# Patient Record
Sex: Female | Born: 1990 | Race: White | Hispanic: No | Marital: Married | Smoking: Never smoker
Health system: Southern US, Community
[De-identification: ages and names within clinical notes are randomized; demographics above are authoritative.]

## PROBLEM LIST (undated history)

## (undated) HISTORY — PX: WISDOM TOOTH EXTRACTION: SHX21

---

## 2017-01-08 DIAGNOSIS — Z01419 Encounter for gynecological examination (general) (routine) without abnormal findings: Secondary | ICD-10-CM | POA: Diagnosis not present

## 2017-01-08 DIAGNOSIS — Z6821 Body mass index (BMI) 21.0-21.9, adult: Secondary | ICD-10-CM | POA: Diagnosis not present

## 2017-10-09 DIAGNOSIS — J309 Allergic rhinitis, unspecified: Secondary | ICD-10-CM | POA: Diagnosis not present

## 2017-10-09 DIAGNOSIS — R05 Cough: Secondary | ICD-10-CM | POA: Diagnosis not present

## 2017-10-23 DIAGNOSIS — J359 Chronic disease of tonsils and adenoids, unspecified: Secondary | ICD-10-CM | POA: Diagnosis not present

## 2017-10-29 DIAGNOSIS — J342 Deviated nasal septum: Secondary | ICD-10-CM | POA: Diagnosis not present

## 2017-10-29 DIAGNOSIS — Z8709 Personal history of other diseases of the respiratory system: Secondary | ICD-10-CM | POA: Diagnosis not present

## 2017-10-29 DIAGNOSIS — D1801 Hemangioma of skin and subcutaneous tissue: Secondary | ICD-10-CM | POA: Diagnosis not present

## 2017-10-30 DIAGNOSIS — R7989 Other specified abnormal findings of blood chemistry: Secondary | ICD-10-CM | POA: Diagnosis not present

## 2018-05-21 DIAGNOSIS — Z6821 Body mass index (BMI) 21.0-21.9, adult: Secondary | ICD-10-CM | POA: Diagnosis not present

## 2018-05-21 DIAGNOSIS — Z124 Encounter for screening for malignant neoplasm of cervix: Secondary | ICD-10-CM | POA: Diagnosis not present

## 2018-05-21 DIAGNOSIS — Z01419 Encounter for gynecological examination (general) (routine) without abnormal findings: Secondary | ICD-10-CM | POA: Diagnosis not present

## 2018-05-24 ENCOUNTER — Other Ambulatory Visit: Payer: Self-pay | Admitting: Obstetrics

## 2018-05-24 DIAGNOSIS — N6320 Unspecified lump in the left breast, unspecified quadrant: Secondary | ICD-10-CM

## 2018-05-27 ENCOUNTER — Ambulatory Visit
Admission: RE | Admit: 2018-05-27 | Discharge: 2018-05-27 | Disposition: A | Discharge status: Home / Self Care | Payer: BLUE CROSS/BLUE SHIELD | Source: Ambulatory Visit | Attending: Obstetrics | Admitting: Obstetrics

## 2018-05-27 ENCOUNTER — Other Ambulatory Visit: Payer: Self-pay | Admitting: Obstetrics

## 2018-05-27 DIAGNOSIS — N6321 Unspecified lump in the left breast, upper outer quadrant: Secondary | ICD-10-CM | POA: Diagnosis not present

## 2018-05-27 DIAGNOSIS — N6320 Unspecified lump in the left breast, unspecified quadrant: Secondary | ICD-10-CM

## 2018-05-27 DIAGNOSIS — N6323 Unspecified lump in the left breast, lower outer quadrant: Secondary | ICD-10-CM | POA: Diagnosis not present

## 2018-06-04 ENCOUNTER — Ambulatory Visit
Admission: RE | Admit: 2018-06-04 | Discharge: 2018-06-04 | Disposition: A | Discharge status: Home / Self Care | Payer: BLUE CROSS/BLUE SHIELD | Source: Ambulatory Visit | Attending: Obstetrics | Admitting: Obstetrics

## 2018-06-04 DIAGNOSIS — N6320 Unspecified lump in the left breast, unspecified quadrant: Secondary | ICD-10-CM

## 2018-06-04 DIAGNOSIS — D242 Benign neoplasm of left breast: Secondary | ICD-10-CM | POA: Diagnosis not present

## 2018-06-04 DIAGNOSIS — N6321 Unspecified lump in the left breast, upper outer quadrant: Secondary | ICD-10-CM | POA: Diagnosis not present

## 2018-10-12 DIAGNOSIS — N644 Mastodynia: Secondary | ICD-10-CM | POA: Diagnosis not present

## 2018-10-12 DIAGNOSIS — Z6822 Body mass index (BMI) 22.0-22.9, adult: Secondary | ICD-10-CM | POA: Diagnosis not present

## 2019-04-21 DIAGNOSIS — N390 Urinary tract infection, site not specified: Secondary | ICD-10-CM | POA: Diagnosis not present

## 2019-04-21 DIAGNOSIS — Z6822 Body mass index (BMI) 22.0-22.9, adult: Secondary | ICD-10-CM | POA: Diagnosis not present

## 2019-05-13 DIAGNOSIS — N390 Urinary tract infection, site not specified: Secondary | ICD-10-CM | POA: Diagnosis not present

## 2019-07-28 DIAGNOSIS — Z01419 Encounter for gynecological examination (general) (routine) without abnormal findings: Secondary | ICD-10-CM | POA: Diagnosis not present

## 2019-07-28 DIAGNOSIS — Z6822 Body mass index (BMI) 22.0-22.9, adult: Secondary | ICD-10-CM | POA: Diagnosis not present

## 2019-12-22 IMAGING — US US BREAST BX W LOC DEV 1ST LESION IMG BX SPEC US GUIDE*L*
1 series · 12 of 12 positions shown · non-contrast
Comparison: Previous exam(s).

ADDENDUM:
Pathology revealed FIBROADENOMA of the Left breast, upper outer,
2:30 o'clock. This was found to be concordant by Dr. Guyano Babajee.

Pathology results were discussed with the patient by telephone. The
patient reported doing well after the biopsy with tenderness at the
site. Post biopsy instructions and care were reviewed and questions
were answered. The patient was encouraged to call The [REDACTED]
The patient was instructed to continue with monthly self breast
examinations, clinical follow-up as needed, and to return for annual
mammography at 39. The patient was informed a reminder notice would
be sent regarding this appointment.
Pathology results reported by Rolf Kristian Langeteig, RN on 06/08/2018.
CLINICAL DATA: 27-year-old female for tissue sampling of
UPPER-OUTER LEFT breast mass.
EXAM:
ULTRASOUND GUIDED LEFT BREAST CORE NEEDLE BIOPSY

[Series 1: us breast bx w loc dev 1st lesion img bx spec us g · 0.05mm/px · 12 of 12 slices shown]
[im 1/12]
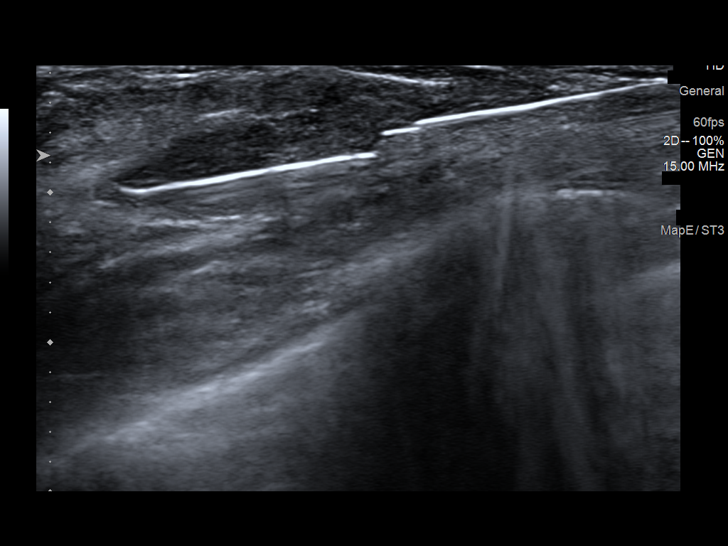
[im 2/12]
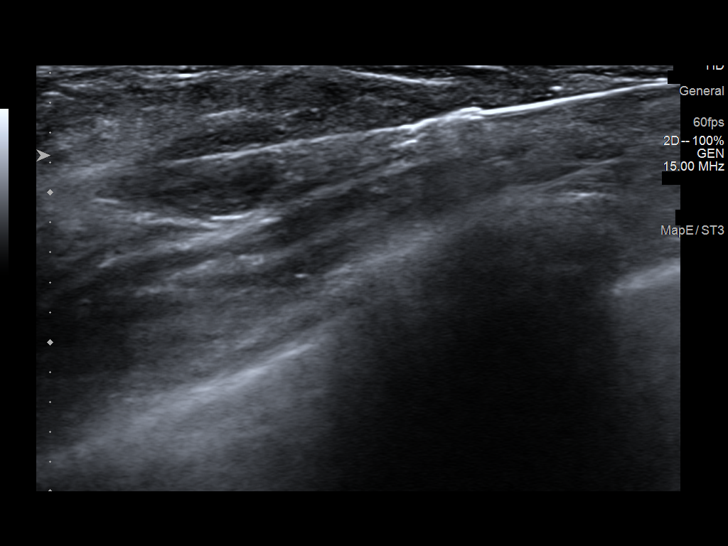
[im 3/12]
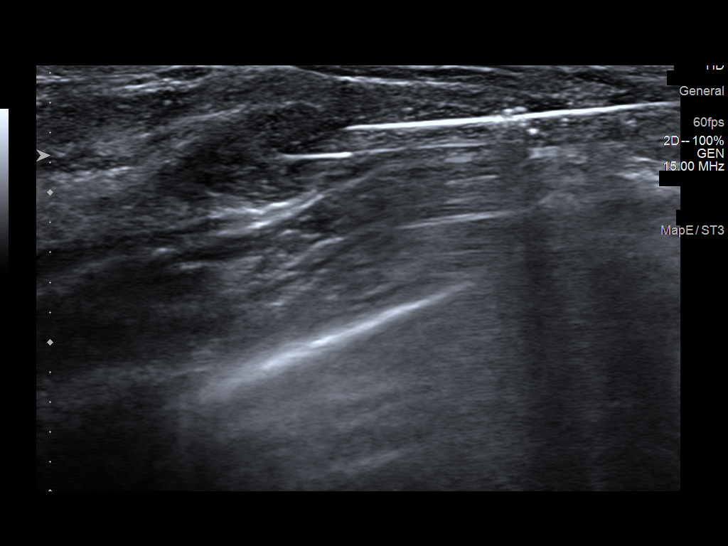
[im 4/12]
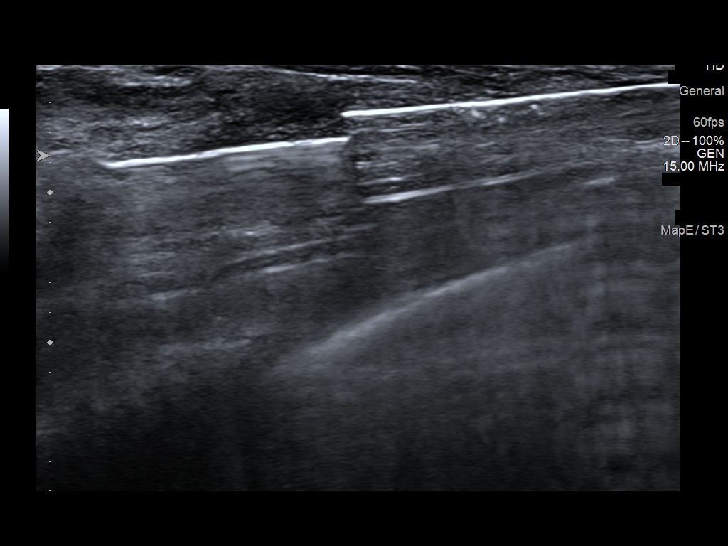
[im 5/12]
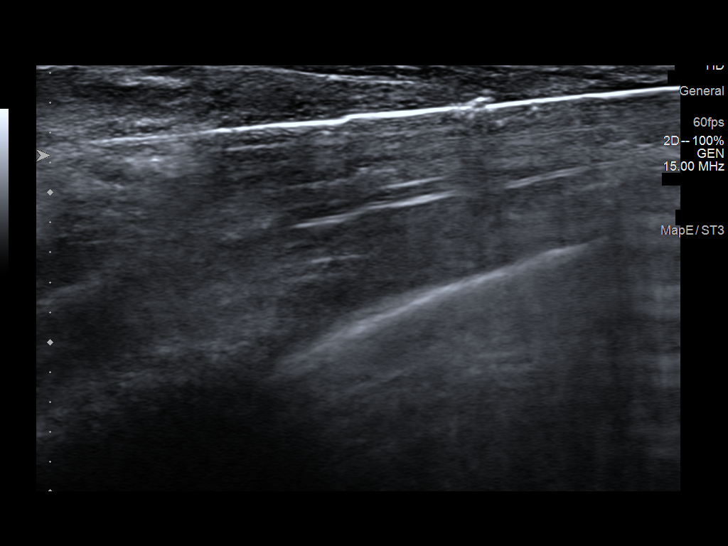
[im 6/12]
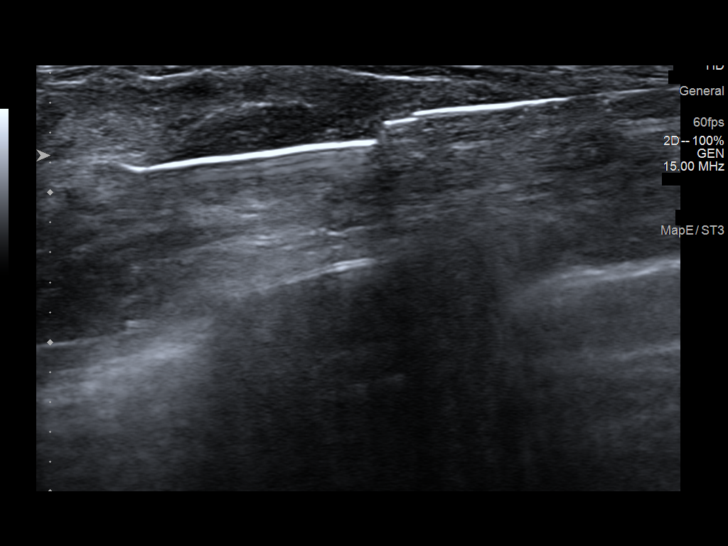
[im 7/12]
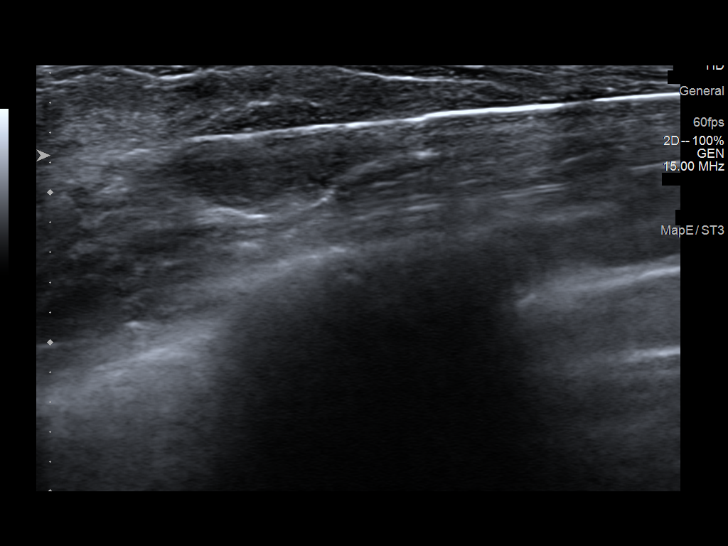
[im 8/12]
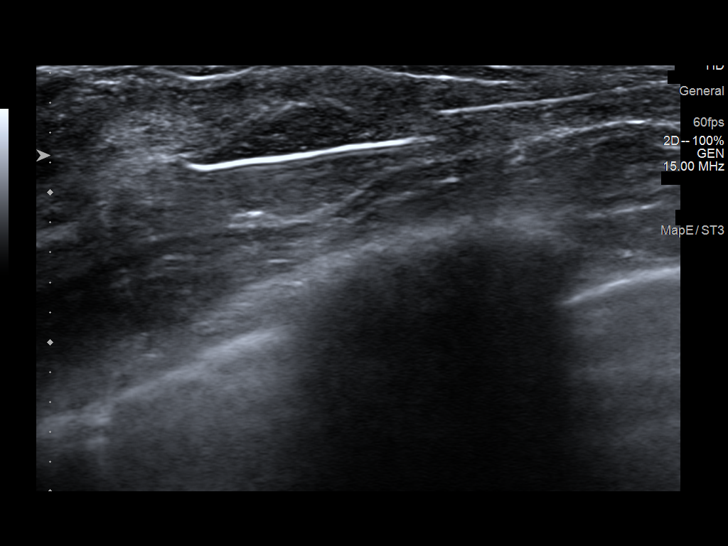
[im 9/12]
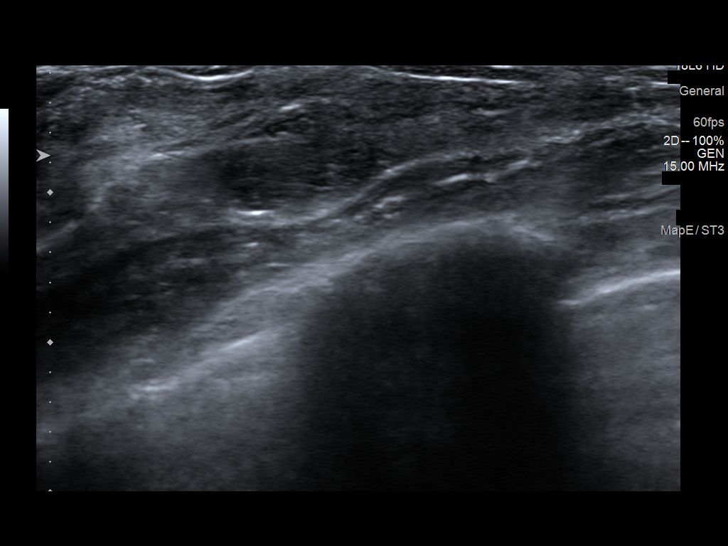
[im 10/12]
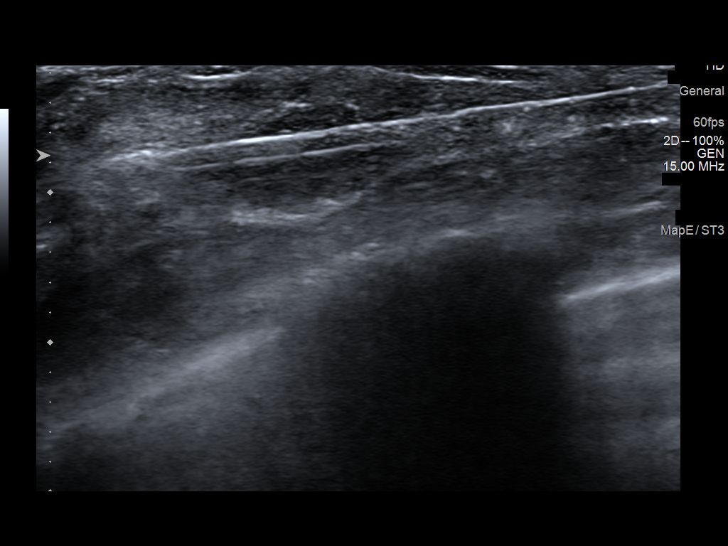
[im 11/12]
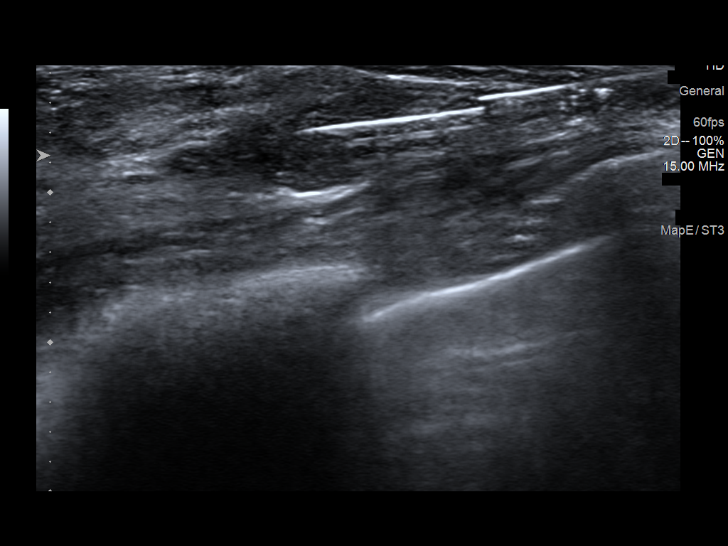
[im 12/12]
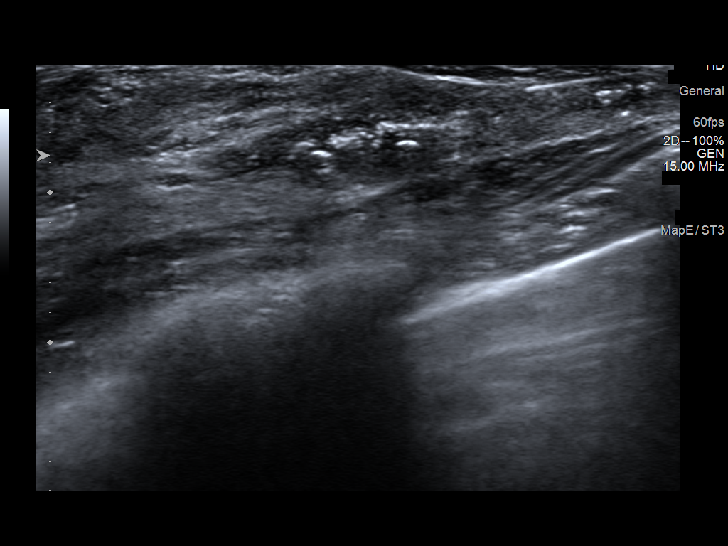

[12 of 12 positions shown; findings below may reference images not displayed]



Lesion quadrant: UPPER-OUTER LEFT breast

Using sterile technique and 1% Lidocaine as local anesthetic, under
direct ultrasound visualization, a 14 gauge Martinenaite device was
used to perform biopsy of the 1.3 cm mass at the [DATE] position of
the LEFT breast 8 cm from the nipple using a LATERAL approach.

At the conclusion of the procedure a spiral HydroMARK tissue marker
clip was deployed into the mass, with placement confirmed
sonographically
IMPRESSION: Ultrasound guided biopsy of 1.3 cm UPPER-OUTER LEFT breast mass. No
apparent complications.

## 2020-06-04 DIAGNOSIS — N925 Other specified irregular menstruation: Secondary | ICD-10-CM | POA: Diagnosis not present

## 2020-06-04 DIAGNOSIS — Z3481 Encounter for supervision of other normal pregnancy, first trimester: Secondary | ICD-10-CM | POA: Diagnosis not present

## 2020-06-04 DIAGNOSIS — Z113 Encounter for screening for infections with a predominantly sexual mode of transmission: Secondary | ICD-10-CM | POA: Diagnosis not present

## 2020-06-04 DIAGNOSIS — Z3480 Encounter for supervision of other normal pregnancy, unspecified trimester: Secondary | ICD-10-CM | POA: Diagnosis not present

## 2020-06-04 DIAGNOSIS — Z23 Encounter for immunization: Secondary | ICD-10-CM | POA: Diagnosis not present

## 2020-06-04 DIAGNOSIS — Z3201 Encounter for pregnancy test, result positive: Secondary | ICD-10-CM | POA: Diagnosis not present

## 2020-07-06 DIAGNOSIS — Z3480 Encounter for supervision of other normal pregnancy, unspecified trimester: Secondary | ICD-10-CM | POA: Diagnosis not present

## 2020-07-06 DIAGNOSIS — Z369 Encounter for antenatal screening, unspecified: Secondary | ICD-10-CM | POA: Diagnosis not present

## 2020-07-06 DIAGNOSIS — Z3481 Encounter for supervision of other normal pregnancy, first trimester: Secondary | ICD-10-CM | POA: Diagnosis not present

## 2020-07-06 LAB — OB RESULTS CONSOLE GC/CHLAMYDIA
Chlamydia: NEGATIVE
Gonorrhea: NEGATIVE

## 2020-07-06 LAB — OB RESULTS CONSOLE ABO/RH: RH Type: POSITIVE

## 2020-07-06 LAB — OB RESULTS CONSOLE HEPATITIS B SURFACE ANTIGEN: Hepatitis B Surface Ag: NEGATIVE

## 2020-07-06 LAB — OB RESULTS CONSOLE HIV ANTIBODY (ROUTINE TESTING): HIV: NONREACTIVE

## 2020-07-06 LAB — OB RESULTS CONSOLE RUBELLA ANTIBODY, IGM: Rubella: IMMUNE

## 2020-07-06 LAB — OB RESULTS CONSOLE RPR: RPR: NONREACTIVE

## 2020-08-03 DIAGNOSIS — Z3482 Encounter for supervision of other normal pregnancy, second trimester: Secondary | ICD-10-CM | POA: Diagnosis not present

## 2020-08-03 DIAGNOSIS — Z369 Encounter for antenatal screening, unspecified: Secondary | ICD-10-CM | POA: Diagnosis not present

## 2020-08-18 NOTE — L&D Delivery Note (Signed)
Delivery Note Emily Burch is a G1P0 at [redacted]w[redacted]d who had a spontaneous delivery at 05:06  a viable female was delivered via  LOA.  APGAR: 8 ,9  ; weight pending  .    Admitted for labor. Progressed normally. Received epidural for pain management. AROM at 03:13. Pushed for 34 minutes. Baby was delivered without difficulty. Loose nuchal cord x 1.   Delayed cord clamping for 60 seconds. Delivery of placenta was spontaneous.   Third degree (type 3A) perineal laceration was identified, involving <50% of the EAS. The torn portion of the EAS was identified, grasped, and reapproximated using 2-0 vicryl suture in 3 interrupted stitches. The perineal portion was repaired with 3-0 vicryl in normal sterile fashion. A superficial bleeding right periurethral laceration was noted. A straight catheter was placed for urethral identification and the laceration was repaired with 3-0 vicryl in 2 figure of 8 stitches. Good hemostasis appreciated. DRE with good rectal tone and no sutures.  Uterine fundus firm. Estimated blood loss 250cc.  Instrument and gauze counts were correct at the end of the procedure. Ancef 2g x 1 ordered due to 3rd degree laceration.    Placenta status: to L&D Anesthesia:  epidural Episiotomy:  none Lacerations:  Type 3A, right periurethral Suture Repair: 2.0 3.0 vicryl Est. Blood Loss (mL):   Mom to postpartum.  Baby to Couplet care / Skin to Skin.  [name] 01/14/2021, 5:49 AM

## 2020-08-30 ENCOUNTER — Other Ambulatory Visit: Payer: Self-pay | Admitting: Obstetrics and Gynecology

## 2020-08-30 ENCOUNTER — Other Ambulatory Visit: Payer: Self-pay

## 2020-08-30 ENCOUNTER — Ambulatory Visit: Payer: BC Managed Care – PPO | Attending: Obstetrics and Gynecology

## 2020-08-30 DIAGNOSIS — O322XX0 Maternal care for transverse and oblique lie, not applicable or unspecified: Secondary | ICD-10-CM | POA: Diagnosis not present

## 2020-08-30 DIAGNOSIS — Z3A19 19 weeks gestation of pregnancy: Secondary | ICD-10-CM | POA: Diagnosis not present

## 2020-08-30 DIAGNOSIS — Z363 Encounter for antenatal screening for malformations: Secondary | ICD-10-CM

## 2020-09-07 DIAGNOSIS — Z369 Encounter for antenatal screening, unspecified: Secondary | ICD-10-CM | POA: Diagnosis not present

## 2020-10-11 DIAGNOSIS — Z369 Encounter for antenatal screening, unspecified: Secondary | ICD-10-CM | POA: Diagnosis not present

## 2020-10-26 DIAGNOSIS — Z369 Encounter for antenatal screening, unspecified: Secondary | ICD-10-CM | POA: Diagnosis not present

## 2020-10-26 DIAGNOSIS — Z3480 Encounter for supervision of other normal pregnancy, unspecified trimester: Secondary | ICD-10-CM | POA: Diagnosis not present

## 2020-11-09 DIAGNOSIS — Z369 Encounter for antenatal screening, unspecified: Secondary | ICD-10-CM | POA: Diagnosis not present

## 2020-11-27 DIAGNOSIS — Z369 Encounter for antenatal screening, unspecified: Secondary | ICD-10-CM | POA: Diagnosis not present

## 2020-11-27 DIAGNOSIS — R8271 Bacteriuria: Secondary | ICD-10-CM | POA: Diagnosis not present

## 2020-12-10 DIAGNOSIS — Z369 Encounter for antenatal screening, unspecified: Secondary | ICD-10-CM | POA: Diagnosis not present

## 2020-12-26 DIAGNOSIS — Z369 Encounter for antenatal screening, unspecified: Secondary | ICD-10-CM | POA: Diagnosis not present

## 2020-12-26 DIAGNOSIS — Z3480 Encounter for supervision of other normal pregnancy, unspecified trimester: Secondary | ICD-10-CM | POA: Diagnosis not present

## 2020-12-26 LAB — OB RESULTS CONSOLE GBS: GBS: NEGATIVE

## 2021-01-02 DIAGNOSIS — Z369 Encounter for antenatal screening, unspecified: Secondary | ICD-10-CM | POA: Diagnosis not present

## 2021-01-13 ENCOUNTER — Other Ambulatory Visit: Payer: Self-pay

## 2021-01-13 ENCOUNTER — Encounter (HOSPITAL_COMMUNITY): Payer: Self-pay | Admitting: Obstetrics and Gynecology

## 2021-01-13 ENCOUNTER — Inpatient Hospital Stay (HOSPITAL_COMMUNITY)
Admission: AD | Admit: 2021-01-13 | Discharge: 2021-01-16 | DRG: 768 | Disposition: A | Discharge status: Home / Self Care | Payer: BC Managed Care – PPO | Attending: Obstetrics and Gynecology | Admitting: Obstetrics and Gynecology

## 2021-01-13 ENCOUNTER — Inpatient Hospital Stay (HOSPITAL_COMMUNITY): Payer: BC Managed Care – PPO | Admitting: Anesthesiology

## 2021-01-13 DIAGNOSIS — U071 COVID-19: Secondary | ICD-10-CM | POA: Diagnosis present

## 2021-01-13 DIAGNOSIS — Z3A39 39 weeks gestation of pregnancy: Secondary | ICD-10-CM | POA: Diagnosis not present

## 2021-01-13 DIAGNOSIS — O479 False labor, unspecified: Secondary | ICD-10-CM

## 2021-01-13 DIAGNOSIS — O9852 Other viral diseases complicating childbirth: Secondary | ICD-10-CM | POA: Diagnosis not present

## 2021-01-13 DIAGNOSIS — O26893 Other specified pregnancy related conditions, third trimester: Secondary | ICD-10-CM | POA: Diagnosis not present

## 2021-01-13 LAB — CBC
HCT: 42 % (ref 36.0–46.0)
Hemoglobin: 13.9 g/dL (ref 12.0–15.0)
MCH: 31.5 pg (ref 26.0–34.0)
MCHC: 33.1 g/dL (ref 30.0–36.0)
MCV: 95.2 fL (ref 80.0–100.0)
Platelets: 201 10*3/uL (ref 150–400)
RBC: 4.41 MIL/uL (ref 3.87–5.11)
RDW: 12.9 % (ref 11.5–15.5)
WBC: 13.2 10*3/uL — ABNORMAL HIGH (ref 4.0–10.5)
nRBC: 0 % (ref 0.0–0.2)

## 2021-01-13 LAB — RESP PANEL BY RT-PCR (FLU A&B, COVID) ARPGX2
Influenza A by PCR: NEGATIVE
Influenza B by PCR: NEGATIVE
SARS Coronavirus 2 by RT PCR: POSITIVE — AB

## 2021-01-13 LAB — TYPE AND SCREEN
ABO/RH(D): O POS
Antibody Screen: NEGATIVE

## 2021-01-13 MED ORDER — PHENYLEPHRINE 40 MCG/ML (10ML) SYRINGE FOR IV PUSH (FOR BLOOD PRESSURE SUPPORT): 80.0000 ug | PREFILLED_SYRINGE | INTRAVENOUS | Status: DC | PRN

## 2021-01-13 MED ORDER — SOD CITRATE-CITRIC ACID 500-334 MG/5ML PO SOLN: 30.0000 mL | ORAL | Status: DC | PRN

## 2021-01-13 MED ORDER — LIDOCAINE HCL (PF) 1 % IJ SOLN: 30.0000 mL | INTRAMUSCULAR | Status: DC | PRN

## 2021-01-13 MED ORDER — FENTANYL CITRATE (PF) 100 MCG/2ML IJ SOLN: 50.0000 ug | INTRAMUSCULAR | Status: DC | PRN

## 2021-01-13 MED ORDER — ONDANSETRON HCL 4 MG/2ML IJ SOLN: 4.0000 mg | Freq: Four times a day (QID) | INTRAMUSCULAR | Status: DC | PRN

## 2021-01-13 MED ORDER — EPHEDRINE 5 MG/ML INJ: 10.0000 mg | INTRAVENOUS | Status: DC | PRN

## 2021-01-13 MED ORDER — LACTATED RINGERS IV SOLN
500.0000 mL | Freq: Once | INTRAVENOUS | Status: AC
  Administered 2021-01-13: 500 mL via INTRAVENOUS

## 2021-01-13 MED ORDER — OXYTOCIN BOLUS FROM INFUSION
333.0000 mL | Freq: Once | INTRAVENOUS | Status: AC
  Administered 2021-01-14: 333 mL via INTRAVENOUS

## 2021-01-13 MED ORDER — OXYCODONE-ACETAMINOPHEN 5-325 MG PO TABS
2.0000 | ORAL_TABLET | ORAL | Status: DC | PRN
Start: 2021-01-13 — End: 2021-01-14

## 2021-01-13 MED ORDER — FENTANYL-BUPIVACAINE-NACL 0.5-0.125-0.9 MG/250ML-% EP SOLN: 12.0000 mL/h | EPIDURAL | Status: DC | PRN

## 2021-01-13 MED ORDER — DIPHENHYDRAMINE HCL 50 MG/ML IJ SOLN: 12.5000 mg | INTRAMUSCULAR | Status: DC | PRN

## 2021-01-13 MED ORDER — OXYTOCIN-SODIUM CHLORIDE 30-0.9 UT/500ML-% IV SOLN
2.5000 [IU]/h | INTRAVENOUS | Status: DC
  Administered 2021-01-14: 2.5 [IU]/h via INTRAVENOUS
  Filled 2021-01-13: qty 500

## 2021-01-13 MED ORDER — LACTATED RINGERS IV SOLN
500.0000 mL | INTRAVENOUS | Status: DC | PRN
Start: 2021-01-13 — End: 2021-01-14

## 2021-01-13 MED ORDER — ACETAMINOPHEN 325 MG PO TABS: 650.0000 mg | ORAL_TABLET | ORAL | Status: DC | PRN

## 2021-01-13 MED ORDER — OXYCODONE-ACETAMINOPHEN 5-325 MG PO TABS: 1.0000 | ORAL_TABLET | ORAL | Status: DC | PRN

## 2021-01-13 MED ORDER — DIPHENHYDRAMINE HCL 50 MG/ML IJ SOLN
12.5000 mg | INTRAMUSCULAR | Status: DC | PRN
Start: 2021-01-13 — End: 2021-01-14

## 2021-01-13 MED ORDER — LACTATED RINGERS IV SOLN: INTRAVENOUS | Status: DC

## 2021-01-13 MED ORDER — LIDOCAINE HCL (PF) 1 % IJ SOLN
INTRAMUSCULAR | Status: DC | PRN
  Administered 2021-01-13: 8 mL via EPIDURAL

## 2021-01-13 MED ORDER — FENTANYL-BUPIVACAINE-NACL 0.5-0.125-0.9 MG/250ML-% EP SOLN
12.0000 mL/h | EPIDURAL | Status: DC | PRN
  Administered 2021-01-13: 12 mL/h via EPIDURAL
  Filled 2021-01-13: qty 250

## 2021-01-13 NOTE — MAU Note (Signed)
.   Emily Burch is a 30 y.o. at [redacted]w[redacted]d here in MAU reporting: ctx that became more intense this morning around 0500. She also reports bloody show. Denies LOF. Endorses fetal movement.   Pain score: 6 Vitals:   01/13/21 1733  BP: 139/81  Pulse: 88  Resp: 16  Temp: 98.4 F (36.9 C)

## 2021-01-13 NOTE — Anesthesia Preprocedure Evaluation (Signed)
Anesthesia Evaluation  Patient identified by MRN, date of birth, ID band Patient awake    Reviewed: Allergy & Precautions, H&P , NPO status , Patient's Chart, lab work & pertinent test results, reviewed documented beta blocker date and time   Airway Mallampati: I  TM Distance: >3 FB Neck ROM: full    Dental no notable dental hx. (+) Teeth Intact, Dental Advisory Given   Pulmonary neg pulmonary ROS,    Pulmonary exam normal breath sounds clear to auscultation       Cardiovascular negative cardio ROS Normal cardiovascular exam Rhythm:regular Rate:Normal     Neuro/Psych negative neurological ROS  negative psych ROS   GI/Hepatic negative GI ROS, Neg liver ROS,   Endo/Other  negative endocrine ROS  Renal/GU negative Renal ROS  negative genitourinary   Musculoskeletal   Abdominal   Peds  Hematology negative hematology ROS (+)   Anesthesia Other Findings   Reproductive/Obstetrics (+) Pregnancy                             Anesthesia Physical Anesthesia Plan  ASA: II  Anesthesia Plan: Epidural   Post-op Pain Management:    Induction:   PONV Risk Score and Plan: 2  Airway Management Planned: Natural Airway  Additional Equipment:   Intra-op Plan:   Post-operative Plan:   Informed Consent: I have reviewed the patients History and Physical, chart, labs and discussed the procedure including the risks, benefits and alternatives for the proposed anesthesia with the patient or authorized representative who has indicated his/her understanding and acceptance.       Plan Discussed with: Anesthesiologist  Anesthesia Plan Comments:         Anesthesia Quick Evaluation

## 2021-01-13 NOTE — Anesthesia Procedure Notes (Signed)
Epidural Patient location during procedure: OB Start time: 01/13/2021 9:14 PM End time: 01/13/2021 9:19 PM  Staffing Anesthesiologist: Bethena Midget, MD  Preanesthetic Checklist Completed: patient identified, IV checked, site marked, risks and benefits discussed, surgical consent, monitors and equipment checked, pre-op evaluation and timeout performed  Epidural Patient position: sitting Prep: DuraPrep and site prepped and draped Patient monitoring: continuous pulse ox and blood pressure Approach: midline Location: L4-L5 Injection technique: LOR air  Needle:  Needle type: Tuohy  Needle gauge: 17 G Needle length: 9 cm and 9 Needle insertion depth: 7 cm Catheter type: closed end flexible Catheter size: 19 Gauge Catheter at skin depth: 13 cm Test dose: negative  Assessment Events: blood not aspirated, injection not painful, no injection resistance, no paresthesia and negative IV test

## 2021-01-13 NOTE — H&P (Signed)
Emily Burch is a 30 y.o. female G1P0 [redacted]w[redacted]d presenting for painful contractions that started around 0500 this morning. She reports no LOF, VB. Normal FM.   Pregnancy c/b: 1. COVID+: tested positive at home on 01/06/21. Had very mild symptoms of cough, congestion, and fatigue. She is now feeling very well without any coughing or shortness of breath. Her husband tested positive on 01/01/21. They are both fully vaccinated and boosted with Pfizer COVID vaccines.   OB History    Gravida  1   Para      Term      Preterm      AB      Living        SAB      IAB      Ectopic      Multiple      Live Births             History reviewed. No pertinent past medical history. Past Surgical History:  Procedure Laterality Date  . WISDOM TOOTH EXTRACTION     Family History: family history is not on file. Social History:  reports that she has never smoked. She has never used smokeless tobacco. She reports previous alcohol use. She reports that she does not use drugs.     Maternal Diabetes: No Genetic Screening: Normal Maternal Ultrasounds/Referrals: Normal Fetal Ultrasounds or other Referrals:  None Maternal Substance Abuse:  No Significant Maternal Medications:  None Significant Maternal Lab Results:  Group B Strep negative Other Comments:  None  Review of Systems Per HPI Exam Physical Exam  Dilation: 5.5 Effacement (%): 70 Station: -3 Exam by:: Holly Flippin RN Blood pressure 124/75, pulse 78, temperature 98.6 F (37 C), temperature source Oral, resp. rate 18, height 5\' 4"  (1.626 m), weight 73.5 kg, SpO2 98 %.  Gen: NAD, resting comfortably, expresses mild discomfort with contractions Resp: nonlabored Abd: Gravid abdomen, Leopolds 7# Ext; no calf edema or tenderness Psych: normal affect, pleasant SVE by MAU RN: 5.5/70/-3, vertex, membranes intact  Fetal testing: FHR 140bpm, moderate variability, + accels, no decels Toco: ctx q 2-3 min Prenatal labs: ABO,  Rh:  --/--/PENDING (05/29 1748) Antibody: PENDING (05/29 1748) Rubella: Immune (11/19 0000) RPR: Nonreactive (11/19 0000)  HBsAg: Negative (11/19 0000)  HIV: Non-reactive (11/19 0000)  GBS: Negative/-- (05/11 0000)   Assessment/Plan: 30Y G1P0 @ 39.0, labor 1. Labor: currently contracting regularly, continue expectant management. Offered AROM, patient declined at this time.  2. COVID+: day #7 since positive test, COVID isolation per guidelines through day #10 3. Pain control: patient undecided about epidural, she may have one at any time if she requests 4. Fetal wellbeing: cat I tracing    10-14-1975 01/13/2021, 6:53 PM

## 2021-01-14 LAB — RPR: RPR Ser Ql: NONREACTIVE

## 2021-01-14 MED ORDER — BENZOCAINE-MENTHOL 20-0.5 % EX AERO
1.0000 "application " | INHALATION_SPRAY | CUTANEOUS | Status: DC | PRN
  Filled 2021-01-14: qty 56

## 2021-01-14 MED ORDER — COCONUT OIL OIL: 1.0000 "application " | TOPICAL_OIL | Status: DC | PRN

## 2021-01-14 MED ORDER — ZOLPIDEM TARTRATE 5 MG PO TABS: 5.0000 mg | ORAL_TABLET | Freq: Every evening | ORAL | Status: DC | PRN

## 2021-01-14 MED ORDER — ONDANSETRON HCL 4 MG/2ML IJ SOLN
4.0000 mg | INTRAMUSCULAR | Status: DC | PRN
Start: 2021-01-14 — End: 2021-01-16

## 2021-01-14 MED ORDER — DIBUCAINE (PERIANAL) 1 % EX OINT
1.0000 "application " | TOPICAL_OINTMENT | CUTANEOUS | Status: DC | PRN
  Filled 2021-01-14: qty 28

## 2021-01-14 MED ORDER — IBUPROFEN 600 MG PO TABS
600.0000 mg | ORAL_TABLET | Freq: Four times a day (QID) | ORAL | Status: DC
  Administered 2021-01-14 – 2021-01-16 (×8): 600 mg via ORAL
  Filled 2021-01-14 (×8): qty 1

## 2021-01-14 MED ORDER — SIMETHICONE 80 MG PO CHEW
80.0000 mg | CHEWABLE_TABLET | ORAL | Status: DC | PRN
Start: 2021-01-14 — End: 2021-01-16

## 2021-01-14 MED ORDER — OXYCODONE HCL 5 MG PO TABS: 5.0000 mg | ORAL_TABLET | ORAL | Status: DC | PRN

## 2021-01-14 MED ORDER — OXYCODONE HCL 5 MG PO TABS
10.0000 mg | ORAL_TABLET | ORAL | Status: DC | PRN
Start: 2021-01-14 — End: 2021-01-16

## 2021-01-14 MED ORDER — CEFAZOLIN SODIUM-DEXTROSE 2-4 GM/100ML-% IV SOLN
2.0000 g | Freq: Once | INTRAVENOUS | Status: AC
  Administered 2021-01-14: 2 g via INTRAVENOUS
  Filled 2021-01-14: qty 100

## 2021-01-14 MED ORDER — WITCH HAZEL-GLYCERIN EX PADS: 1.0000 "application " | MEDICATED_PAD | CUTANEOUS | Status: DC | PRN

## 2021-01-14 MED ORDER — ONDANSETRON HCL 4 MG PO TABS: 4.0000 mg | ORAL_TABLET | ORAL | Status: DC | PRN

## 2021-01-14 MED ORDER — PRENATAL MULTIVITAMIN CH
1.0000 | ORAL_TABLET | Freq: Every day | ORAL | Status: DC
  Administered 2021-01-14 – 2021-01-15 (×2): 1 via ORAL
  Filled 2021-01-14 (×2): qty 1

## 2021-01-14 MED ORDER — MAGNESIUM HYDROXIDE 400 MG/5ML PO SUSP
30.0000 mL | ORAL | Status: DC | PRN
  Administered 2021-01-14: 30 mL via ORAL
  Filled 2021-01-14: qty 30

## 2021-01-14 MED ORDER — ACETAMINOPHEN 325 MG PO TABS: 650.0000 mg | ORAL_TABLET | ORAL | Status: DC | PRN

## 2021-01-14 MED ORDER — DOCUSATE SODIUM 100 MG PO CAPS
100.0000 mg | ORAL_CAPSULE | Freq: Two times a day (BID) | ORAL | Status: DC
  Administered 2021-01-15 (×2): 100 mg via ORAL
  Filled 2021-01-14 (×2): qty 1

## 2021-01-14 MED ORDER — DIPHENHYDRAMINE HCL 25 MG PO CAPS: 25.0000 mg | ORAL_CAPSULE | Freq: Four times a day (QID) | ORAL | Status: DC | PRN

## 2021-01-14 MED ORDER — TETANUS-DIPHTH-ACELL PERTUSSIS 5-2.5-18.5 LF-MCG/0.5 IM SUSY: 0.5000 mL | PREFILLED_SYRINGE | Freq: Once | INTRAMUSCULAR | Status: DC

## 2021-01-14 NOTE — Progress Notes (Addendum)
OB Progress Note  RN had notified me of exam at 0130: 9/100/0, position felt to be OP  S:Patient feeling some rectal pressure but comfortable with epidural   O: Today's Vitals   01/14/21 0130 01/14/21 0200 01/14/21 0230 01/14/21 0305  BP: 122/79 110/62 116/68 (!) 92/57  Pulse: 92 81 83 76  Resp: 16 17 17 16   Temp:      TempSrc:      SpO2:      Weight:      Height:      PainSc:  0-No pain     Body mass index is 27.81 kg/m.  SVE by me 9/100/0, posterior cervical lip, position felt to be ROP, AROM clear/blood tinged fluid  FHR: 140bpm, moderate variability, + accels, no decels Toco: Toco ctx q 2-3 min   A/P: 30Y G1P0 @ [redacted]w[redacted]d, labor 1. Labor: progressing spontaneously, now s/p AROM. Patient has made change from 0130 exam, at that time cervix was noted circumferentially, and is now posterior lip only. Continue position changes, peanut ball, plan to recheck in 2 hours if no indication earlier.  2. COVID+: covid precautions 3. Pain control: epidural  M. [redacted]w[redacted]d, MD

## 2021-01-14 NOTE — Anesthesia Postprocedure Evaluation (Signed)
Anesthesia Post Note  Patient: Emily Burch  Procedure(s) Performed: AN AD HOC LABOR EPIDURAL     Patient location during evaluation: Mother Baby Anesthesia Type: Epidural Level of consciousness: awake Pain management: satisfactory to patient Vital Signs Assessment: post-procedure vital signs reviewed and stable Respiratory status: spontaneous breathing Cardiovascular status: stable Anesthetic complications: no   No complications documented.  Last Vitals:  Vitals:   01/14/21 0855 01/14/21 1229  BP: 103/70 108/67  Pulse: 79 83  Resp: 16 16  Temp: 37.1 C   SpO2: 99% 99%    Last Pain:  Vitals:   01/14/21 1743  TempSrc:   PainSc: 0-No pain   Pain Goal:                   [employer]

## 2021-01-14 NOTE — Lactation Note (Signed)
This note was copied from a baby's chart. Lactation Consultation Note  Patient Name: Emily Burch [id number] Date: 01/14/2021 Reason for consult: Follow-up assessment;Primapara;1st time breastfeeding;Term Age:31 hours  2nd LC visit - mom called with feeding cues.  Shallow latch noted. LC had mom release suction and LC assisted to obtain depth and baby fed for right breast 20 mins / mec stool diaper changed / and re-latched on the left breast / football / with depth / swallows and still feeding at 10 mins / per mom comfortable.  LC reviewed basic breast feeding teaching and worked with mom to be consistent with depth with latching. Latch of 8 x2   LC provided the [facility] resource brochure / and BFSG .   Maternal Data Does the patient have breastfeeding experience prior to this delivery?: No  Feeding Mother's Current Feeding Choice: Breast Milk  LATCH Score Latch: Grasps breast easily, tongue down, lips flanged, rhythmical sucking.  Audible Swallowing: A few with stimulation  Type of Nipple: Everted at rest and after stimulation  Comfort (Breast/Nipple): Soft / non-tender  Hold (Positioning): Assistance needed to correctly position infant at breast and maintain latch.  LATCH Score: 8   Lactation Tools Discussed/Used Tools: Shells;Pump;Flanges Flange Size: 24 Breast pump type: Manual Pump Education: Setup, frequency, and cleaning;Milk Storage Reason for Pumping: if needed to pre pump due to short shaft nipple amd areola edema  Interventions Interventions: Breast feeding basics reviewed;Assisted with latch;Skin to skin;Breast massage;Hand express;Reverse pressure;Pre-pump if needed;Breast compression;Adjust position;Support pillows;Position options;Shells;Hand pump;Education  Discharge Pump: Personal;Manual WIC Program: No  Consult Status Consult Status: Follow-up Date: 01/14/21 Follow-up type: In-patient    Emily Burch Emily Burch 01/14/2021, 1:14 PM

## 2021-01-15 LAB — CBC
HCT: 34.4 % — ABNORMAL LOW (ref 36.0–46.0)
Hemoglobin: 11.2 g/dL — ABNORMAL LOW (ref 12.0–15.0)
MCH: 31.2 pg (ref 26.0–34.0)
MCHC: 32.6 g/dL (ref 30.0–36.0)
MCV: 95.8 fL (ref 80.0–100.0)
Platelets: 179 10*3/uL (ref 150–400)
RBC: 3.59 MIL/uL — ABNORMAL LOW (ref 3.87–5.11)
RDW: 13.3 % (ref 11.5–15.5)
WBC: 14.6 10*3/uL — ABNORMAL HIGH (ref 4.0–10.5)
nRBC: 0 % (ref 0.0–0.2)

## 2021-01-15 NOTE — Lactation Note (Deleted)
This note was copied from a baby's chart. Lactation Consultation Note  Patient Name: Emily Burch [id number] Date: 01/15/2021 Reason for consult: Follow-up assessment;1st time breastfeeding;Term;Infant weight loss (-3%, mom has been wear breast shells in bra, nipple edema is resolving, mom switched to 24 mm NS, better fit and using 27 mm breast flange when pumping. Infant is now sustaining latch with NS.) Age:31 hours, term female infant had 4 voids and 5 stools per parents, stools are now green in color. Per mom, infant is improving with his latch his feedings are now 15 minutes in length using the NS.  LC did not observe latch at this time, infant was asleep,  per mom, infant recently BF for 13 minutes less than 3 hours. Mom had 2 vials of colostrum in fridge she will offer infant  Her EBM after latching him at the breast.  Mom is now starting to see colostrum when pumping.  LC discussed with parents infant may cluster feed Day 2 and this is normal infant behavior. Mom will continue to use DEBP every 3 hours for 15 minutes on initial setting and give infant back any EBM. Mom will wear breast shells in bra during the day, nipple edema is starting to resolve. Mom knows to call RN or LC if she needs further latch assistance with latching infant at the breast.  Maternal Data    Feeding Mother's Current Feeding Choice: Breast Milk  LATCH Score                    Lactation Tools Discussed/Used    Interventions Interventions: Skin to skin;Breast compression;Expressed milk;Education;DEBP;Shells;Comfort gels  Discharge    Consult Status Consult Status: Follow-up Date: 01/16/21 Follow-up type: In-patient    Danelle Earthly 01/15/2021, 8:49 PM

## 2021-01-15 NOTE — Progress Notes (Signed)
Post Partum Day 1 Subjective: no complaints, up ad lib, voiding and tolerating PO. Desires to be discharged tomorrow in order to work on breastfeeding today.  Objective: Patient Vitals for the past 24 hrs:  BP Temp Temp src Pulse Resp SpO2  01/15/21 0822 110/78 (!) 97.5 F (36.4 C) Oral 75 17 99 %  01/15/21 0549 99/72 98.4 F (36.9 C) Oral 77 18 --  01/14/21 2245 107/75 98.4 F (36.9 C) Oral 78 16 98 %  01/14/21 2000 106/74 98.4 F (36.9 C) Oral 78 18 --  01/14/21 1625 99/62 98.4 F (36.9 C) Oral 83 16 98 %  01/14/21 1229 108/67 -- -- 83 16 99 %    Physical Exam:  General: alert and no distress Lochia: appropriate Uterine Fundus: firm DVT Evaluation: No evidence of DVT seen on physical exam.  Recent Labs    01/13/21 2055 01/15/21 0551  WBC 13.2* 14.6*  HGB 13.9 11.2*  HCT 42.0 34.4*  PLT 201 179   Assessment/Plan: Plan for discharge tomorrow  Emily Burch 30 y.o. G1P1 PPD#1 sp TSVD 1. PPC: Hgb 13.9>11.2, EBL 250cc, 3A laceration repaired. Discussed bowel regimen  2. COVID+, symptoms 5/19, tested COVID neg at home 5/19, continued to have symptoms and tested positive at home 5/22. Symptoms now improved. Based on day of symptoms, CDC recommends well-fitting mask through 5/29.  3. Desires neonatal circumcision, R/B/A of procedure discussed at length. Pt understands that neonatal circumcision is not considered medically necessary and is elective. The risks include, but are not limited to bleeding, infection, damage to the penis, development of scar tissue, and having to have it redone at a later date. Pt understands theses risks and wishes to proceed. 4. Rubella Immune, blood type O+, breastfeeding, baby boy, birth control plans to discuss at [facility] visit. UTD on vaccines (s/p tdap, flu, COVID series and booster)   LOS: 2 days   Emily Burch 01/15/2021, 9:52 AM

## 2021-01-15 NOTE — Lactation Note (Signed)
This note was copied from a baby's chart. Lactation Consultation Note  Patient Name: Emily Burch [id number] Date: 01/15/2021 Reason for consult: Follow-up assessment;1st time breastfeeding;Term (-1% weight loss) Age:31 hours, infant with 5 stools and parents unsure of void diapers. Mom felt she did not have enough colostrum for infant. LC reviewed hand expression and dad assisted mom, infant was given 2 mls of colostrum by spoon.  LC explained infant's small tummy size the first few days of life and that infant is currently cluster feeding on Day 2 of life and this is normal infant behavior. LC assisted mom with positioning and latching infant at the breast. Mom latched infant on her left breast using the football hold position, infant latched with depth, swallows could be heard " cuh" sounding, per mom, she doesn't feel pain with latch and infant is sustaining latch. Infant was still breastfeeding after 15 minutes when LC left room, mom knows to breastfeed infant on both breast during a feeding. Mom knows if she feels pain with latch to break latch and re-latch infant at the breast. Mom knows to call RN or LC for latch assistance if needed.   Maternal Data    Feeding Mother's Current Feeding Choice: Breast Milk  LATCH Score Latch: Grasps breast easily, tongue down, lips flanged, rhythmical sucking.  Audible Swallowing: Spontaneous and intermittent  Type of Nipple: Everted at rest and after stimulation  Comfort (Breast/Nipple): Soft / non-tender  Hold (Positioning): Assistance needed to correctly position infant at breast and maintain latch.  LATCH Score: 9   Lactation Tools Discussed/Used Tools: Pump Flange Size: 24  Interventions Interventions: Pre-pump if needed;Breast compression;Adjust position;Support pillows;Position options;Expressed milk;Hand pump;Education  Discharge    Consult Status Consult Status: Follow-up Date: 01/16/21 Follow-up type:  In-patient    Danelle Earthly 01/15/2021, 11:20 PM

## 2021-01-16 MED ORDER — IBUPROFEN 600 MG PO TABS: 600.0000 mg | ORAL_TABLET | Freq: Four times a day (QID) | ORAL | 0 refills | Status: AC | PRN

## 2021-01-16 MED ORDER — OXYCODONE-ACETAMINOPHEN 5-325 MG PO TABS: 1.0000 | ORAL_TABLET | Freq: Four times a day (QID) | ORAL | 0 refills | Status: AC | PRN

## 2021-01-16 NOTE — Discharge Summary (Signed)
Postpartum Discharge Summary       Patient Name: Emily Burch DOB: [birth year] MRN: [id number]  Date of admission: 01/13/2021 Delivery date:01/14/2021  Delivering provider: Derl Barrow E  Date of discharge: 01/16/2021  Admitting diagnosis: Uterine contractions [O47.9] Intrauterine pregnancy: [redacted]w[redacted]d     Secondary diagnosis:  Active Problems:   Uterine contractions  Additional problems: Covid infection    Discharge diagnosis: Term Pregnancy Delivered and 3rd degree perineal laceration                                              Post partum procedures:NA Augmentation: AROM Complications: None  Hospital course: Onset of Labor With Vaginal Delivery      30 y.o. yo G1P0 at [redacted]w[redacted]d was admitted in Active Labor on 01/13/2021. Patient had an uncomplicated labor course as follows:  Membrane Rupture Time/Date: 3:13 AM ,01/14/2021   Delivery Method:Vaginal, Spontaneous  Episiotomy: None  Lacerations:    Patient had an uncomplicated postpartum course.  She is ambulating, tolerating a regular diet, passing flatus, and urinating well. Patient is discharged home in stable condition on 01/16/21.  Newborn Data: Birth date:01/14/2021  Birth time:5:06 AM  Gender:Female  Living status:Living  Apgars:8 ,9  Weight:3317 g   Physical exam  Vitals:   01/15/21 0549 01/15/21 0822 01/15/21 2105 01/16/21 0544  BP: 99/72 110/78 101/60 107/68  Pulse: 77 75 73 76  Resp: 18 17 15 18   Temp: 98.4 F (36.9 C) (!) 97.5 F (36.4 C) 98.8 F (37.1 C) 98.5 F (36.9 C)  TempSrc: Oral Oral Oral Oral  SpO2:  99% 98% 100%  Weight:      Height:       General: alert, cooperative and no distress Lochia: appropriate Uterine Fundus: firm Incision: Healing well with no significant drainage DVT Evaluation: No evidence of DVT seen on physical exam. Labs: Lab Results  Component Value Date   WBC 14.6 (H) 01/15/2021   HGB 11.2 (L) 01/15/2021   HCT 34.4 (L) 01/15/2021   MCV 95.8 01/15/2021   PLT  179 01/15/2021   No flowsheet data found. Edinburgh Score: Edinburgh Postnatal Depression Scale Screening Tool 01/14/2021  I have been able to laugh and see the funny side of things. (No Data)     After visit meds:  Allergies as of 01/16/2021   No Known Allergies     Medication List    STOP taking these medications   prenatal multivitamin Tabs tablet     TAKE these medications   ibuprofen 600 MG tablet Commonly known as: ADVIL Take 1 tablet (600 mg total) by mouth every 6 (six) hours as needed.   oxyCODONE-acetaminophen 5-325 MG tablet Commonly known as: PERCOCET/ROXICET Take 1 tablet by mouth every 6 (six) hours as needed for severe pain.            Discharge Care Instructions  (From admission, onward)         Start     Ordered   01/16/21 0000  Discharge wound care:       Comments: For a cesarean delivery: You may wash incision with soap and water.  Do not soak or submerge the incision for 2 weeks. Keep incision dry. You may need to keep a sanitary pad or panty liner between the incision and your clothing for comfort and to keep the incision dry. If you note  drainage, increased pain, or increased redness of the incision, then please notify your physician.   01/16/21 0936   01/16/21 0000  If the dressing is still on your incision site when you go home, remove it on the third day after your surgery date. Remove dressing if it begins to fall off, or if it is dirty or damaged before the third day.       Comments: For a cesarean delivery   01/16/21 0936           Discharge home in stable condition Infant Feeding: Breast Infant Disposition:home with mother Discharge instruction: per After Visit Summary and Postpartum booklet. Activity: Advance as tolerated. Pelvic rest for 6 weeks.  Diet: routine diet Future Appointments:No future appointments. Follow up Visit:  Follow-up Information    [name], MD Follow up in 4 week(s).   Specialty: Obstetrics  and Gynecology Why: For a postpartum evaluation Contact information: [address] [place] [address] [phone]                 01/16/2021 Waynard Reeds, MD

## 2022-03-19 IMAGING — US US MFM OB COMP +14 WKS
1 series · 13 of 28 positions shown · non-contrast
Comparison: none

[Series 1: us mfm ob comp +14 wks · 84 acquisitions, 13 frames shown]
[im 4/84]
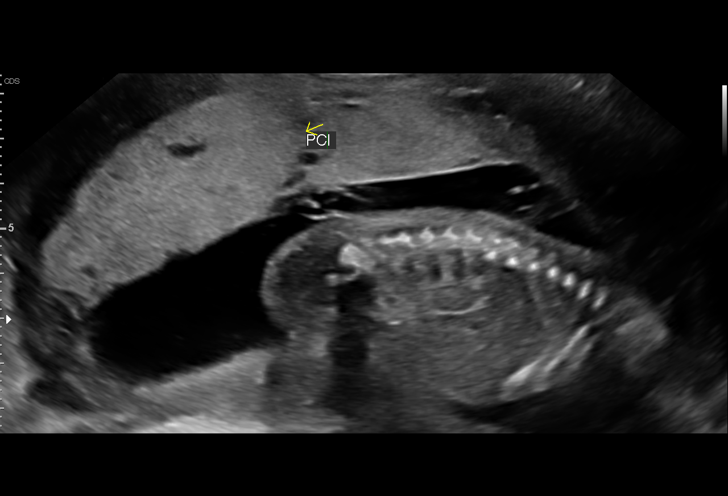
[im 10/84]
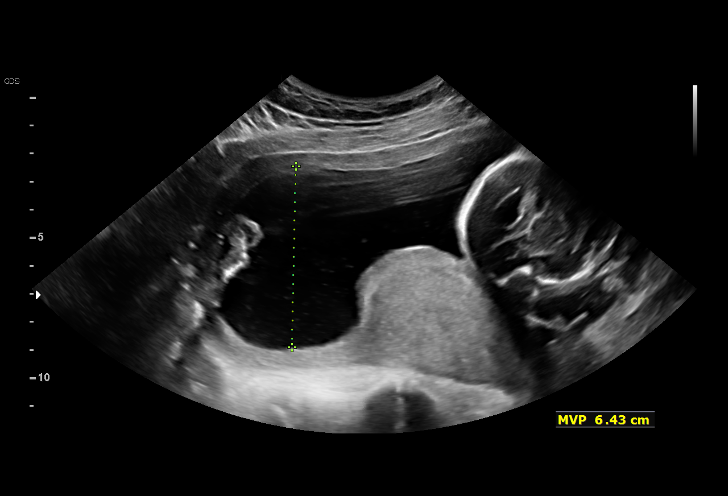
[im 16/84]
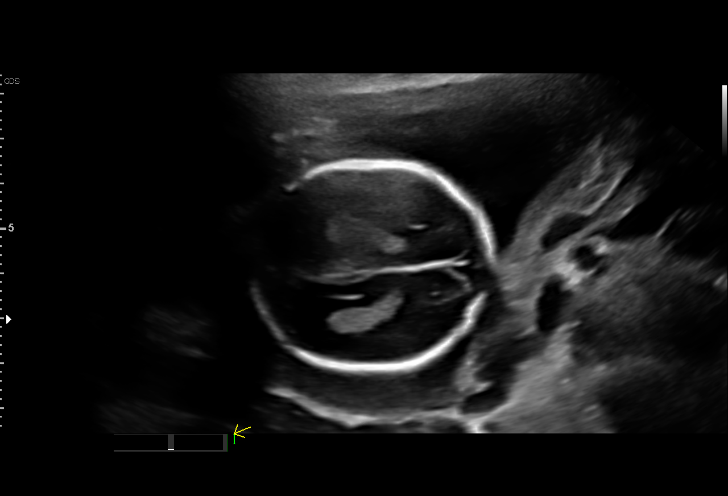
[im 22/84]
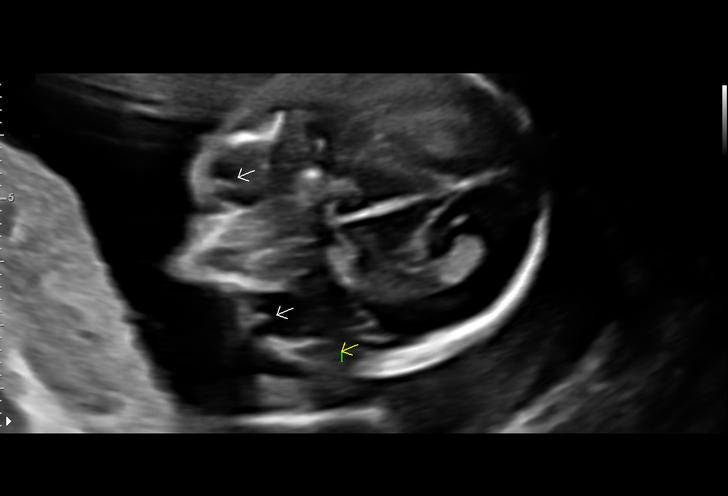
[im 28/84]
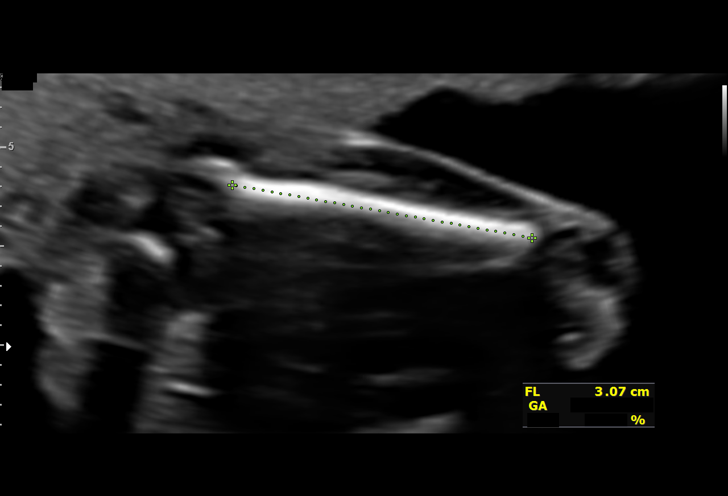
[im 34/84]
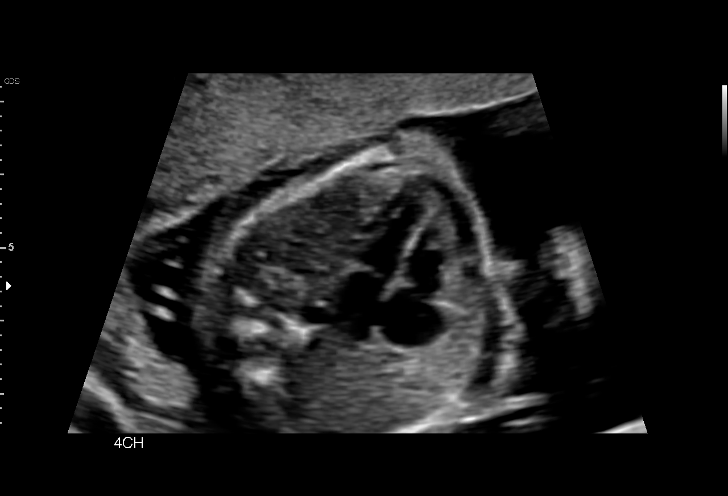
[im 44/84]
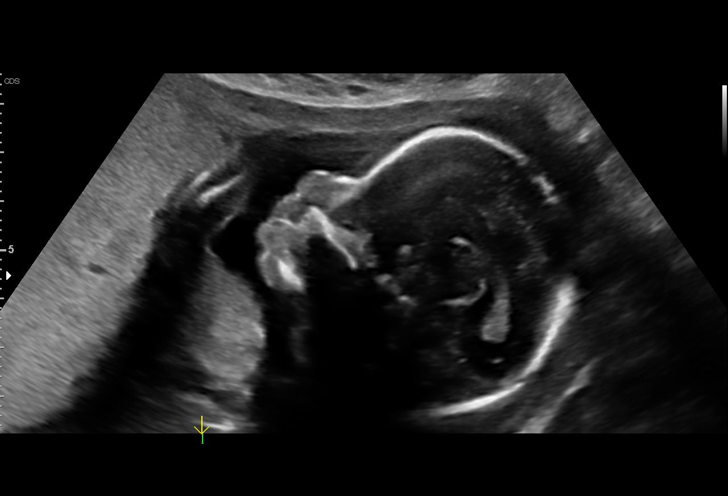
[im 50/84]
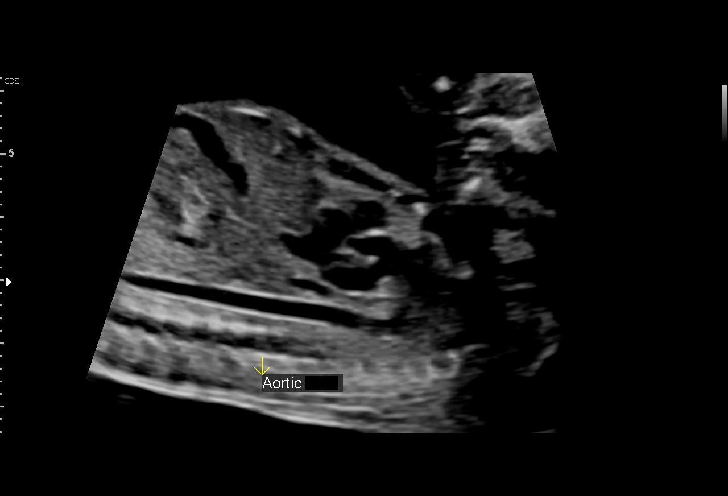
[im 56/84]
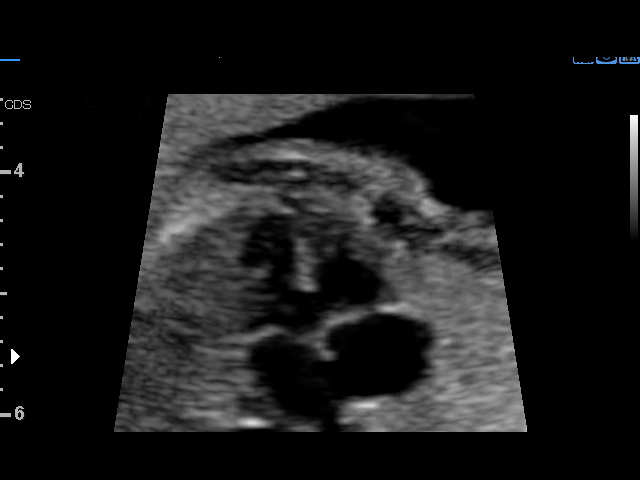
[im 62/84]
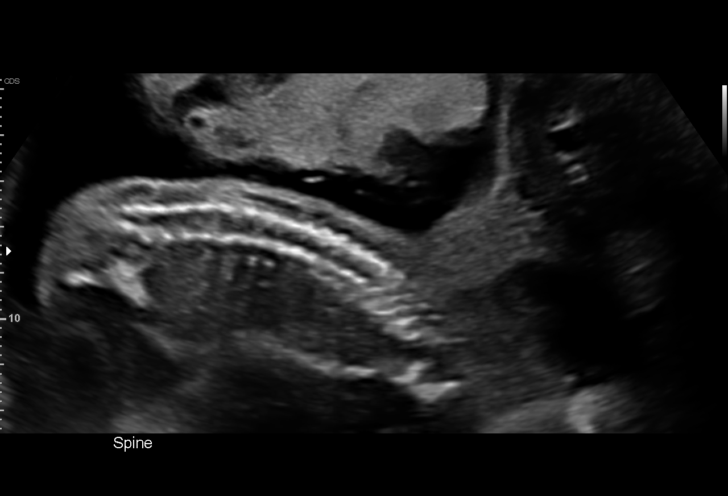
[im 68/84]
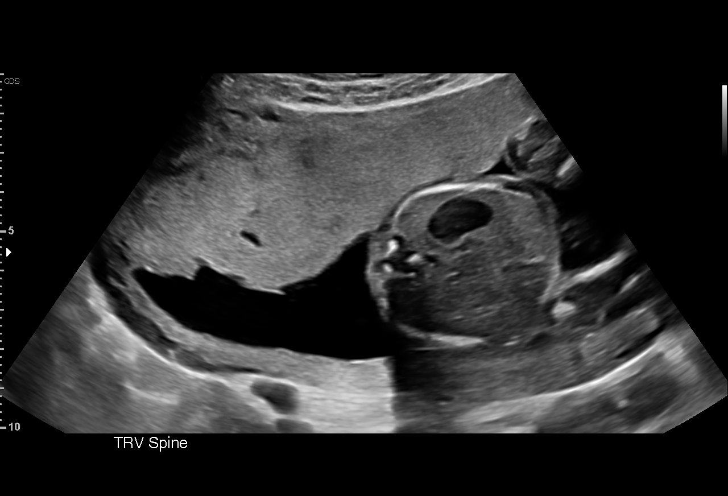
[im 74/84]
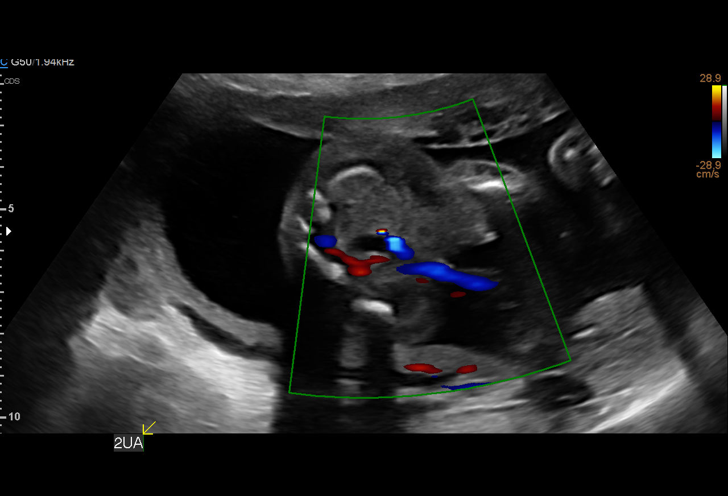
[im 80/84]
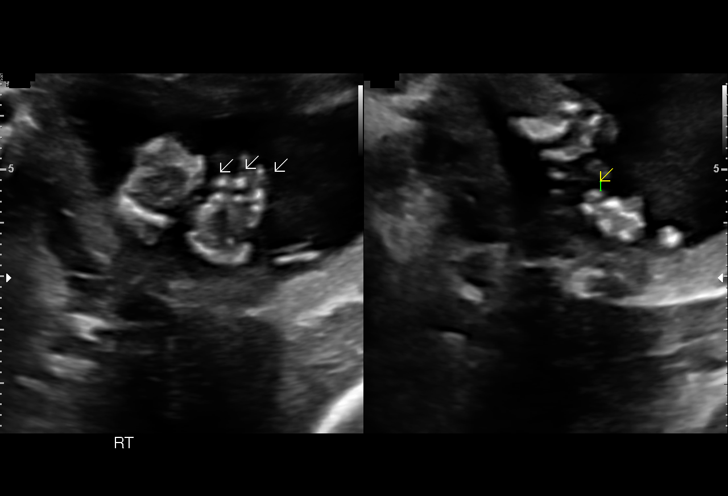

[13 of 28 positions shown; findings below may reference images not displayed]

MARICRUZ DO

 1  US MFM OB COMP + 14 WK                [address]805.01    FRANKY ARRIOLA

Indications

 19 weeks gestation of pregnancy
 Encounter for antenatal screening,
 unspecified (LR NIPS)
Fetal Evaluation

 Num Of Fetuses:         1
 Cardiac Activity:       Observed
 Presentation:           Transverse, head to maternal left
 Placenta:               Anterior
 P. Cord Insertion:      Visualized, central

 Amniotic Fluid
 AFI FV:      Within normal limits

                             Largest Pocket(cm)

Biometry

 BPD:      46.2  mm     G. Age:  20w 0d         67  %    CI:        74.19   %    70 - 86
                                                         FL/HC:      17.9   %    16.8 -
 HC:      170.3  mm     G. Age:  19w 4d         46  %    HC/AC:      1.06        1.09 -
 AC:      161.3  mm     G. Age:  21w 2d         90  %    FL/BPD:     66.0   %
 FL:       30.5  mm     G. Age:  19w 3d         38  %    FL/AC:      18.9   %    20 - 24
 CER:      19.8  mm     G. Age:  19w 1d         45  %
 NFT:       4.9  mm
 LV:        6.1  mm
 CM:          5  mm
 Est. FW:     346  gm    0 lb 12 oz      86  %
OB History

 Gravidity:    1
Gestational Age

 Clinical EDD:  19w 4d                                        EDD:   01/20/21
 U/S Today:     20w 1d                                        EDD:   01/16/21
 Best:          19w 4d     Det. By:  Clinical EDD             EDD:   01/20/21
Anatomy

 Cranium:               Appears normal         Aortic Arch:            Appears normal
 Cavum:                 Appears normal         Ductal Arch:            Appears normal
 Ventricles:            Appears normal         Diaphragm:              Appears normal
 Choroid Plexus:        Appears normal         Stomach:                Appears normal, left
                                                                       sided
 Cerebellum:            Appears normal         Abdomen:                Appears normal
 Posterior Fossa:       Appears normal         Abdominal Wall:         Appears nml (cord
                                                                       insert, abd wall)
 Nuchal Fold:           Appears normal         Cord Vessels:           Appears normal (3
                                                                       vessel cord)
 Face:                  Appears normal         Kidneys:                Appear normal
                        (orbits and profile)
 Lips:                  Appears normal         Bladder:                Appears normal
 Thoracic:              Appears normal         Spine:                  Appears normal
 Heart:                 Appears normal         Upper Extremities:      Appears normal
                        (4CH, axis, and
                        situs)
 RVOT:                  Appears normal         Lower Extremities:      Appears normal
 LVOT:                  Appears normal

 Other:  Fetal sex appears male.
Cervix Uterus Adnexa

 Cervix
 Length:           3.64  cm.
 Normal appearance by transabdominal scan.

 Right Ovary
 Not visualized.

 Left Ovary
 Not visualized.
Impression

 G1 P0.  Patient is here for fetal anatomy scan.
 On cell-free fetal DNA screening, the risks of fetal
 aneuploidies are not increased .

 We performed fetal anatomy scan. No makers of
 aneuploidies or fetal structural defects are seen. Fetal
 biometry is consistent with her previously-established dates.
 Amniotic fluid is normal and good fetal activity is seen.
 Patient understands the limitations of ultrasound in detecting
 fetal anomalies.

 "MyChart":Patient does not want to know fetal sex/gender.
 We informed her that fetal sex/gender will be mentioned in
 ultrasound report and will be in "MyChart" that will be
 accessible to the patient .
                 Umeta, [place]
